# Patient Record
Sex: Female | Born: 1975 | Race: Asian | Hispanic: No | Marital: Married | Smoking: Former smoker
Health system: Southern US, Community
[De-identification: ages and names within clinical notes are randomized; demographics above are authoritative.]

## PROBLEM LIST (undated history)

## (undated) DIAGNOSIS — C801 Malignant (primary) neoplasm, unspecified: Secondary | ICD-10-CM

## (undated) HISTORY — DX: Malignant (primary) neoplasm, unspecified: C80.1

## (undated) HISTORY — PX: ABDOMINAL HYSTERECTOMY: SHX81

---

## 2016-09-15 ENCOUNTER — Other Ambulatory Visit: Payer: Self-pay | Admitting: Family Medicine

## 2016-09-15 DIAGNOSIS — N644 Mastodynia: Secondary | ICD-10-CM

## 2016-09-23 ENCOUNTER — Ambulatory Visit
Admission: RE | Admit: 2016-09-23 | Discharge: 2016-09-23 | Disposition: A | Discharge status: Home / Self Care | Payer: 59 | Source: Ambulatory Visit | Attending: Family Medicine | Admitting: Family Medicine

## 2016-09-23 DIAGNOSIS — N644 Mastodynia: Secondary | ICD-10-CM

## 2017-09-20 ENCOUNTER — Other Ambulatory Visit: Payer: Self-pay | Admitting: Family Medicine

## 2017-09-20 DIAGNOSIS — Z1231 Encounter for screening mammogram for malignant neoplasm of breast: Secondary | ICD-10-CM

## 2017-10-20 ENCOUNTER — Ambulatory Visit: Payer: 59

## 2017-10-20 ENCOUNTER — Ambulatory Visit
Admission: RE | Admit: 2017-10-20 | Discharge: 2017-10-20 | Disposition: A | Discharge status: Home / Self Care | Payer: 59 | Source: Ambulatory Visit | Attending: Family Medicine | Admitting: Family Medicine

## 2017-10-20 DIAGNOSIS — Z1231 Encounter for screening mammogram for malignant neoplasm of breast: Secondary | ICD-10-CM

## 2018-11-11 ENCOUNTER — Other Ambulatory Visit: Payer: Self-pay | Admitting: Family Medicine

## 2018-11-11 DIAGNOSIS — Z1231 Encounter for screening mammogram for malignant neoplasm of breast: Secondary | ICD-10-CM

## 2018-12-26 ENCOUNTER — Ambulatory Visit: Payer: 59

## 2019-02-10 ENCOUNTER — Other Ambulatory Visit: Payer: Self-pay

## 2019-02-10 ENCOUNTER — Ambulatory Visit
Admission: RE | Admit: 2019-02-10 | Discharge: 2019-02-10 | Disposition: A | Discharge status: Home / Self Care | Payer: Managed Care, Other (non HMO) | Source: Ambulatory Visit | Attending: Family Medicine | Admitting: Family Medicine

## 2019-02-10 DIAGNOSIS — Z1231 Encounter for screening mammogram for malignant neoplasm of breast: Secondary | ICD-10-CM

## 2019-05-19 ENCOUNTER — Other Ambulatory Visit: Payer: Self-pay

## 2019-05-22 ENCOUNTER — Other Ambulatory Visit: Payer: Self-pay

## 2019-05-22 ENCOUNTER — Ambulatory Visit (INDEPENDENT_AMBULATORY_CARE_PROVIDER_SITE_OTHER): Payer: 59 | Admitting: Obstetrics and Gynecology

## 2019-05-22 ENCOUNTER — Encounter: Payer: Self-pay | Admitting: Obstetrics and Gynecology

## 2019-05-22 VITALS — BP 118/74 | Ht 63.0 in | Wt 121.0 lb

## 2019-05-22 DIAGNOSIS — Z01419 Encounter for gynecological examination (general) (routine) without abnormal findings: Secondary | ICD-10-CM

## 2019-05-22 DIAGNOSIS — Z8542 Personal history of malignant neoplasm of other parts of uterus: Secondary | ICD-10-CM | POA: Diagnosis not present

## 2019-05-22 DIAGNOSIS — Z1272 Encounter for screening for malignant neoplasm of vagina: Secondary | ICD-10-CM | POA: Diagnosis not present

## 2019-05-22 DIAGNOSIS — C55 Malignant neoplasm of uterus, part unspecified: Secondary | ICD-10-CM | POA: Insufficient documentation

## 2019-05-22 NOTE — Progress Notes (Signed)
   Timika Drolet 05-02-1975 [id number]  SUBJECTIVE:  44 y.o. G1P0010 female new patient for annual routine gynecologic exam and Pap smear.  She had a hysterectomy in 2017 due to grade 2 endometrial cancer in [place]. Ovaries left insitu.  She says she did not require chemotherapy or radiation.  She is originally from [place] and was diagnosed with endometrial cancer there.  She says she was using hormonal treatments in [place] as she was considering conceiving, but then moved to the [place] in 2016 and she then decided to undergo hysterectomy.  No family history of any cancers. She has no gynecologic concerns at this time.  No vaginal bleeding or pelvic pain.  Starting to feel mild hot flashes and night sweats.  Current Outpatient Medications  Medication Sig Dispense Refill  . Multiple Vitamin (MULTIVITAMIN) tablet Take 1 tablet by mouth daily.     No current facility-administered medications for this visit.   Allergies: Patient has no known allergies.  No LMP recorded. Patient has had a hysterectomy.  Past medical history,surgical history, problem list, medications, allergies, family history and social history were all reviewed and documented as reviewed in the EPIC chart.  ROS:  Feeling well. No dyspnea or chest pain on exertion.  No abdominal pain, change in bowel habits, black or bloody stools.  No urinary tract symptoms. GYN ROS: no abnormal bleeding, pelvic pain or discharge, no breast pain or new or enlarging lumps on self exam. No neurological complaints.   OBJECTIVE:  BP 118/74   Ht 5\' 3"  (1.6 m)   Wt 121 lb (54.9 kg)   BMI 21.43 kg/m  The patient appears well, alert, oriented x 3, in no distress. ENT normal.  Neck supple. No cervical or supraclavicular adenopathy or thyromegaly.  Lungs are clear, good air entry, no wheezes, rhonchi or rales. S1 and S2 normal, no murmurs, regular rate and rhythm.  Abdomen soft without tenderness, guarding, mass or organomegaly.  Neurological is normal, no  focal findings.  BREAST EXAM: breasts appear normal, no suspicious masses, no skin or nipple changes or axillary nodes  PELVIC EXAM: VULVA: normal appearing vulva with no masses, tenderness or lesions, VAGINA: normal appearing vagina with normal color and discharge, no lesions, CERVIX: surgically absent, UTERUS: surgically absent, vaginal cuff normal, ADNEXA: normal adnexa in size, nontender and no masses, RECTAL: normal rectal, no masses, PAP: Pap smear done today, thin-prep method  Chaperone: Caryn Bee present during the examination  ASSESSMENT:  44 y.o. G1P0010 here for annual gynecologic exam  PLAN:   1. No menstrual or significant hormonal concerns.  We discussed perimenopausal symptoms which she may be starting to experience.  Let us know if any significant problems. 2. Pap smear collected today.  Current screening guidelines calling for the 3-year Pap smear cytology only interval. 3. Contraception.  Previous ysterectomy. 4. History of uterine cancer.  Treated in [place]. No records to review today. Sounds like it was caught in early stage.  Recommend continuing annual pelvic exam and Pap smears at routine interval.   Exam NED today. 5. Mammogram 01/2019.  Continue annual mammograms.  Normal breast exam.  Encouraged breast self awareness and notify us of any concerning changes. 6. Health maintenance.  Routine screening blood work usually done with her primary doctor.  Return annually or sooner, prn.  Joseph Pierini MD, FACOG  05/22/19

## 2019-05-22 NOTE — Addendum Note (Signed)
Addended by: Nelva Nay on: 05/22/2019 09:32 AM   Modules accepted: Orders

## 2019-05-23 LAB — PAP IG W/ RFLX HPV ASCU

## 2019-12-08 ENCOUNTER — Other Ambulatory Visit: Payer: Self-pay

## 2019-12-08 ENCOUNTER — Encounter (HOSPITAL_COMMUNITY): Payer: Self-pay

## 2019-12-08 ENCOUNTER — Emergency Department (HOSPITAL_COMMUNITY)
Admission: EM | Admit: 2019-12-08 | Discharge: 2019-12-08 | Disposition: A | Discharge status: Home / Self Care | Payer: 59 | Attending: Emergency Medicine | Admitting: Emergency Medicine

## 2019-12-08 DIAGNOSIS — Z20822 Contact with and (suspected) exposure to covid-19: Secondary | ICD-10-CM | POA: Diagnosis not present

## 2019-12-08 DIAGNOSIS — R45851 Suicidal ideations: Secondary | ICD-10-CM | POA: Insufficient documentation

## 2019-12-08 DIAGNOSIS — Z87891 Personal history of nicotine dependence: Secondary | ICD-10-CM | POA: Insufficient documentation

## 2019-12-08 DIAGNOSIS — F10929 Alcohol use, unspecified with intoxication, unspecified: Secondary | ICD-10-CM

## 2019-12-08 DIAGNOSIS — F1010 Alcohol abuse, uncomplicated: Secondary | ICD-10-CM

## 2019-12-08 LAB — CBC WITH DIFFERENTIAL/PLATELET
Abs Immature Granulocytes: 0.02 10*3/uL (ref 0.00–0.07)
Basophils Absolute: 0.1 10*3/uL (ref 0.0–0.1)
Basophils Relative: 1 %
Eosinophils Absolute: 0 10*3/uL (ref 0.0–0.5)
Eosinophils Relative: 0 %
HCT: 41.4 % (ref 36.0–46.0)
Hemoglobin: 13.6 g/dL (ref 12.0–15.0)
Immature Granulocytes: 0 %
Lymphocytes Relative: 41 %
Lymphs Abs: 3.2 10*3/uL (ref 0.7–4.0)
MCH: 29.6 pg (ref 26.0–34.0)
MCHC: 32.9 g/dL (ref 30.0–36.0)
MCV: 90.2 fL (ref 80.0–100.0)
Monocytes Absolute: 0.6 10*3/uL (ref 0.1–1.0)
Monocytes Relative: 8 %
Neutro Abs: 3.9 10*3/uL (ref 1.7–7.7)
Neutrophils Relative %: 50 %
Platelets: 313 10*3/uL (ref 150–400)
RBC: 4.59 MIL/uL (ref 3.87–5.11)
RDW: 13 % (ref 11.5–15.5)
WBC: 7.7 10*3/uL (ref 4.0–10.5)
nRBC: 0 % (ref 0.0–0.2)

## 2019-12-08 LAB — COMPREHENSIVE METABOLIC PANEL
ALT: 31 U/L (ref 0–44)
AST: 42 U/L — ABNORMAL HIGH (ref 15–41)
Albumin: 4.2 g/dL (ref 3.5–5.0)
Alkaline Phosphatase: 44 U/L (ref 38–126)
Anion gap: 17 — ABNORMAL HIGH (ref 5–15)
BUN: 20 mg/dL (ref 6–20)
CO2: 18 mmol/L — ABNORMAL LOW (ref 22–32)
Calcium: 9 mg/dL (ref 8.9–10.3)
Chloride: 104 mmol/L (ref 98–111)
Creatinine, Ser: 0.77 mg/dL (ref 0.44–1.00)
GFR, Estimated: >60 mL/min (ref >60)
Glucose, Bld: 117 mg/dL — ABNORMAL HIGH (ref 70–99)
Potassium: 3.3 mmol/L — ABNORMAL LOW (ref 3.5–5.1)
Sodium: 139 mmol/L (ref 135–145)
Total Bilirubin: 0.7 mg/dL (ref 0.3–1.2)
Total Protein: 7.1 g/dL (ref 6.5–8.1)

## 2019-12-08 LAB — RESPIRATORY PANEL BY RT PCR (FLU A&B, COVID)
Influenza A by PCR: NEGATIVE
Influenza B by PCR: NEGATIVE
SARS Coronavirus 2 by RT PCR: NEGATIVE

## 2019-12-08 LAB — RAPID URINE DRUG SCREEN, HOSP PERFORMED
Amphetamines: NOT DETECTED
Barbiturates: NOT DETECTED
Benzodiazepines: NOT DETECTED
Cocaine: NOT DETECTED
Opiates: NOT DETECTED
Tetrahydrocannabinol: NOT DETECTED

## 2019-12-08 LAB — ETHANOL: Alcohol, Ethyl (B): 168 mg/dL — ABNORMAL HIGH (ref <10)

## 2019-12-08 LAB — ACETAMINOPHEN LEVEL: Acetaminophen (Tylenol), Serum: <10 ug/mL — ABNORMAL LOW (ref 10–30)

## 2019-12-08 LAB — SALICYLATE LEVEL: Salicylate Lvl: <7 mg/dL — ABNORMAL LOW (ref 7.0–30.0)

## 2019-12-08 LAB — LIPASE, BLOOD: Lipase: 40 U/L (ref 11–51)

## 2019-12-08 MED ORDER — THIAMINE HCL 100 MG PO TABS: 100.0000 mg | ORAL_TABLET | Freq: Every day | ORAL | Status: DC

## 2019-12-08 MED ORDER — SODIUM CHLORIDE 0.9 % IV BOLUS
1000.0000 mL | Freq: Once | INTRAVENOUS | Status: AC
  Administered 2019-12-08: 1000 mL via INTRAVENOUS

## 2019-12-08 MED ORDER — ONDANSETRON HCL 4 MG/2ML IJ SOLN
4.0000 mg | Freq: Once | INTRAMUSCULAR | Status: AC
  Administered 2019-12-08: 4 mg via INTRAVENOUS
  Filled 2019-12-08: qty 2

## 2019-12-08 MED ORDER — LORAZEPAM 2 MG/ML IJ SOLN: 0.0000 mg | Freq: Four times a day (QID) | INTRAMUSCULAR | Status: DC

## 2019-12-08 MED ORDER — LORAZEPAM 1 MG PO TABS: 0.0000 mg | ORAL_TABLET | Freq: Four times a day (QID) | ORAL | Status: DC

## 2019-12-08 MED ORDER — LORAZEPAM 2 MG/ML IJ SOLN: 0.0000 mg | Freq: Two times a day (BID) | INTRAMUSCULAR | Status: DC

## 2019-12-08 MED ORDER — THIAMINE HCL 100 MG/ML IJ SOLN: 100.0000 mg | Freq: Every day | INTRAMUSCULAR | Status: DC

## 2019-12-08 MED ORDER — LORAZEPAM 1 MG PO TABS: 0.0000 mg | ORAL_TABLET | Freq: Two times a day (BID) | ORAL | Status: DC

## 2019-12-08 NOTE — Discharge Instructions (Signed)
For your behavioral health needs, [facility] are advised to follow up with an outpatient therapist.  Contact one of the following providers at your earliest opportunity to schedule an intake appointment:       [facility]. [address] [place], [place] [address]      [phone]       [facility]      [address] [place], [place] [address]      [phone]       [address] [place], [place] [address]      [phone]
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
## 2019-12-08 NOTE — ED Notes (Signed)
Pt refused breakfast tray.  

## 2019-12-08 NOTE — BH Assessment (Signed)
[place] Assessment Progress Note  Per Letitia Libra, NP, this pt does not require psychiatric hospitalization at this time.  Pt is psychiatrically cleared.  Discharge instructions include referral information for area therapists for follow up at pt's initiative.  EDP Lacretia Leigh, MD and pt's nurse, Caryl Pina, have been notified.  Jalene Mullet, [place] Triage Specialist [phone]

## 2019-12-08 NOTE — ED Notes (Signed)
TTS in room assessing patient

## 2019-12-08 NOTE — ED Notes (Signed)
Pt dressed out in hospital provided scrubs. Belongings consisted of black t shirt, pick shorts, and an apple watch. All collected and placed in designated holding area.

## 2019-12-08 NOTE — Consult Note (Signed)
[facility] ED Discharge  12/08/2019 10:07 AM Kristin Reed  MRN:  [id number] Principal Problem: Alcohol intoxication [facility]) Discharge Diagnoses: Principal Problem:   Alcohol intoxication ([place])   Subjective: Patient states "last night I was aggravated and drink a bottle of wine very fast, I got drunk then I started crying."  Patient reports feeling homesick that she has been unable to travel to [place] for a long while.  Patient reports feeling somewhat depressed related to Covid and her inability to travel to [place] where her extended family resides.  Patient reports she is primarily supported by her husband of the home but also speaks with her mother every day via video chat.  Patient denies suicidal ideations currently.  Patient denies any history of self-harm behaviors, denies history of suicide attempts.  Patient assessed by nurse practitioner.  Patient alert and oriented, answers appropriately.  Patient pleasant cooperative during assessment.  Patient request that husband, Max Pablo Ledger, remain in room during assessment.  Patient denies homicidal ideations.  Patient denies any symptoms of paranoia.  There is no evidence of delusional thought content and patient does not appear to be responding to internal stimuli.  Patient reports she sleeps 8 hours or more each day and her appetite is "good."  Patient resides in [place] with her husband.  Patient denies access to weapons.  Patient is currently employed and works from home.  Patient denies substance use.  Patient endorses alcohol use approximately 1 to 2 days/week.  Patient endorses approximately 2 glasses of wine approximately 2 days/week.  At this time patient declines any substance use treatment resources.  Patient reports she does not feel that she needs counseling at this time states "I talk to all my friends all the time."  Patient will be given counseling resources upon discharge.  Patient gives verbal consent to speak with her husband, Max.   Patient's husband denies any concerns for patient safety.  Patient's husband also denies any access to weapons in the home.  Both patient and husband report readiness for patient to discharge home today.  Support and encouragement offered.  Total Time spent with patient: 30 minutes  Past Psychiatric History: None reported  Past Medical History:  Past Medical History:  Diagnosis Date  . Cancer [facility])    Uterine    Past Surgical History:  Procedure Laterality Date  . ABDOMINAL HYSTERECTOMY     TAH   Family History: No family history on file. Family Psychiatric  History: None reported Social History:  Social History   Substance and Sexual Activity  Alcohol Use Yes     Social History   Substance and Sexual Activity  Drug Use Never    Social History   Socioeconomic History  . Marital status: Married    Spouse name: Not on file  . Number of children: Not on file  . Years of education: Not on file  . Highest education level: Not on file  Occupational History  . Not on file  Tobacco Use  . Smoking status: Former Research scientist (life sciences)  . Smokeless tobacco: Never Used  Vaping Use  . Vaping Use: Never used  Substance and Sexual Activity  . Alcohol use: Yes  . Drug use: Never  . Sexual activity: Yes    Birth control/protection: Surgical    Comment: 1st intercourse 44 yo-Fewer than 5 partners  Other Topics Concern  . Not on file  Social History Narrative  . Not on file   Social Determinants of Health   Financial Resource Strain:   .  Difficulty of Paying Living Expenses: Not on file  Food Insecurity:   . Worried About Charity fundraiser in the Last Year: Not on file  . Ran Out of Food in the Last Year: Not on file  Transportation Needs:   . Lack of Transportation (Medical): Not on file  . Lack of Transportation (Non-Medical): Not on file  Physical Activity:   . Days of Exercise per Week: Not on file  . Minutes of Exercise per Session: Not on file  Stress:   . Feeling of  Stress : Not on file  Social Connections:   . Frequency of Communication with Friends and Family: Not on file  . Frequency of Social Gatherings with Friends and Family: Not on file  . Attends Religious Services: Not on file  . Active Member of Clubs or Organizations: Not on file  . Attends Archivist Meetings: Not on file  . Marital Status: Not on file    Has this patient used any form of tobacco in the last 30 days? (Cigarettes, Smokeless Tobacco, Cigars, and/or Pipes) A prescription for an FDA-approved tobacco cessation medication was offered at discharge and the patient refused  Current Medications: Current Facility-Administered Medications  Medication Dose Route Frequency Provider Last Rate Last Admin  . LORazepam (ATIVAN) injection 0-4 mg  0-4 mg Intravenous Q6H Caccavale, Sophia, PA-C       Or  . LORazepam (ATIVAN) tablet 0-4 mg  0-4 mg Oral Q6H Caccavale, Sophia, PA-C      . [START ON 12/10/2019] LORazepam (ATIVAN) injection 0-4 mg  0-4 mg Intravenous Q12H Caccavale, Sophia, PA-C       Or  . [START ON 12/10/2019] LORazepam (ATIVAN) tablet 0-4 mg  0-4 mg Oral Q12H Caccavale, Sophia, PA-C      . thiamine tablet 100 mg  100 mg Oral Daily Caccavale, Sophia, PA-C       Or  . thiamine (B-1) injection 100 mg  100 mg Intravenous Daily Caccavale, Sophia, PA-C       No current outpatient medications on file.   PTA Medications: (Not in a hospital admission)   Musculoskeletal: Strength & Muscle Tone: within normal limits Gait & Station: normal Patient leans: N/A  Psychiatric Specialty Exam: Physical Exam Vitals and nursing note reviewed.  Constitutional:      Appearance: She is well-developed.  HENT:     Head: Normocephalic.  Cardiovascular:     Rate and Rhythm: Normal rate.  Pulmonary:     Effort: Pulmonary effort is normal.  Neurological:     Mental Status: She is alert and oriented to person, place, and time.  Psychiatric:        Attention and Perception:  Attention and perception normal.        Mood and Affect: Mood and affect normal.        Speech: Speech normal.        Behavior: Behavior normal. Behavior is cooperative.        Thought Content: Thought content normal.        Cognition and Memory: Cognition and memory normal.        Judgment: Judgment normal.     Review of Systems  Constitutional: Negative.   HENT: Negative.   Eyes: Negative.   Respiratory: Negative.   Cardiovascular: Negative.   Gastrointestinal: Negative.   Genitourinary: Negative.   Musculoskeletal: Negative.   Skin: Negative.   Neurological: Negative.   Psychiatric/Behavioral: Negative.     Blood pressure 102/65, pulse 67, temperature (!)  97.5 F (36.4 C), temperature source Axillary, resp. rate 14, height 5\' 3"  (1.6 m), weight 55 kg, SpO2 100 %.Body mass index is 21.48 kg/m.  General Appearance: Casual and Fairly Groomed  Eye Contact:  Good  Speech:  Clear and Coherent and Normal Rate  Volume:  Normal  Mood:  Euthymic  Affect:  Appropriate and Congruent  Thought Process:  Coherent, Goal Directed and Descriptions of Associations: Intact  Orientation:  Full (Time, Place, and Person)  Thought Content:  WDL and Logical  Suicidal Thoughts:  No  Homicidal Thoughts:  No  Memory:  Immediate;   Good Recent;   Good Remote;   Good  Judgement:  Good  Insight:  Fair  Psychomotor Activity:  Normal  Concentration:  Concentration: Good and Attention Span: Good  Recall:  Good  Fund of Knowledge:  Good  Language:  Good  Akathisia:  No  Handed:  Right  AIMS (if indicated):     Assets:  Communication Skills Desire for Improvement Financial Resources/Insurance Housing Intimacy Leisure Time Physical Health Resilience Social Support  ADL's:  Intact  Cognition:  WNL  Sleep:        Demographic Factors:  NA  Loss Factors: NA  Historical Factors: NA  Risk Reduction Factors:   Sense of responsibility to family, Living with another person, especially  a relative, Positive social support, Positive therapeutic relationship and Positive coping skills or problem solving skills  Continued Clinical Symptoms:    Cognitive Features That Contribute To Risk:  None    Suicide Risk:  Minimal: No identifiable suicidal ideation.  Patients presenting with no risk factors but with morbid ruminations; may be classified as minimal risk based on the severity of the depressive symptoms    Plan Of Care/Follow-up recommendations:  Other:  Patient reviewed with Dr. Hampton Abbot.  Follow-up with outpatient counseling.  Disposition: Patient cleared by psychiatry. Emmaline Kluver, FNP 12/08/2019, 10:07 AM

## 2019-12-08 NOTE — ED Notes (Signed)
Visitor in room. Visitor checked by security. Visitor brought patient clothes and phone, patient with patient belongings in secure location at nurses desk. Explained to visitor that time permitted is 30 mins and next time to come between 10am-9pm

## 2019-12-08 NOTE — ED Provider Notes (Signed)
Emergency Medicine Observation Re-evaluation Note  Kristin Reed is a 44 y.o. female, seen on rounds today.  Pt initially presented to the ED for complaints of Suicidal Currently, the patient is resting comfortably.  Denies any suicidal ideations..  Physical Exam  BP 103/69    Pulse (!) 55    Temp (!) 97.5 F (36.4 C) (Axillary)    Resp 16    Ht 1.6 m (5\' 3" )    Wt 55 kg    SpO2 99%    BMI 21.48 kg/m  Physical Exam General: Calm Cardiac: Regular rate  Lungs: Clear Psych: At baseline  ED Course / MDM  EKG:    I have reviewed the labs performed to date as well as medications administered while in observation.  Recent changes in the last 24 hours include improved insight .  Plan  Current plan is for discharge per behavioral health recommendations. Patient is not under full IVC at this time.   Kristin Leigh, MD 12/08/19 1020
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
## 2020-03-22 ENCOUNTER — Other Ambulatory Visit: Payer: Self-pay | Admitting: Family Medicine

## 2020-03-22 DIAGNOSIS — Z1231 Encounter for screening mammogram for malignant neoplasm of breast: Secondary | ICD-10-CM

## 2020-05-07 ENCOUNTER — Ambulatory Visit: Payer: 59

## 2020-05-13 ENCOUNTER — Other Ambulatory Visit: Payer: Self-pay

## 2020-05-13 ENCOUNTER — Ambulatory Visit
Admission: RE | Admit: 2020-05-13 | Discharge: 2020-05-13 | Disposition: A | Discharge status: Home / Self Care | Payer: 59 | Source: Ambulatory Visit | Attending: Family Medicine | Admitting: Family Medicine

## 2020-05-13 DIAGNOSIS — Z1231 Encounter for screening mammogram for malignant neoplasm of breast: Secondary | ICD-10-CM

## 2020-11-21 ENCOUNTER — Ambulatory Visit (INDEPENDENT_AMBULATORY_CARE_PROVIDER_SITE_OTHER): Payer: 59 | Admitting: Obstetrics & Gynecology

## 2020-11-21 ENCOUNTER — Other Ambulatory Visit: Payer: Self-pay

## 2020-11-21 ENCOUNTER — Other Ambulatory Visit (HOSPITAL_COMMUNITY)
Admission: RE | Admit: 2020-11-21 | Discharge: 2020-11-21 | Disposition: A | Discharge status: Home / Self Care | Payer: 59 | Source: Ambulatory Visit | Attending: Obstetrics & Gynecology | Admitting: Obstetrics & Gynecology

## 2020-11-21 ENCOUNTER — Encounter: Payer: Self-pay | Admitting: Obstetrics & Gynecology

## 2020-11-21 VITALS — BP 98/64 | HR 84 | Resp 16 | Ht 62.75 in | Wt 122.0 lb

## 2020-11-21 DIAGNOSIS — C55 Malignant neoplasm of uterus, part unspecified: Secondary | ICD-10-CM

## 2020-11-21 DIAGNOSIS — Z1272 Encounter for screening for malignant neoplasm of vagina: Secondary | ICD-10-CM

## 2020-11-21 DIAGNOSIS — Z90710 Acquired absence of both cervix and uterus: Secondary | ICD-10-CM | POA: Diagnosis not present

## 2020-11-21 DIAGNOSIS — N951 Menopausal and female climacteric states: Secondary | ICD-10-CM

## 2020-11-21 DIAGNOSIS — Z01419 Encounter for gynecological examination (general) (routine) without abnormal findings: Secondary | ICD-10-CM | POA: Insufficient documentation

## 2020-11-21 NOTE — Progress Notes (Signed)
Kristin Reed [birth year] [id number]   History:    45 y.o. G1P0A1 Married.  Works for [employer].  RP:  Established patient presenting for annual gyn exam   HPI:  Occasional hot flushes/night sweats.  Started a PhytoEstrogen last year which controls the symptoms.  Previous Hysterectomy for early Endometrial Ca at age 53 in [place] per patient.  Staging surgery in [facility] 2017.  Will obtain patho report.  No pelvic pain.  No pain with IC.  Breasts normal.  Screening mammo neg 04/2020.  Health labs with Fam MD.  BMI 21.78.  Good fitness and healthy nutrition.   Past medical history,surgical history, family history and social history were all reviewed and documented in the EPIC chart.  Gynecologic History No LMP recorded. Patient has had a hysterectomy.  Obstetric History OB History  Gravida Para Term Preterm AB Living  1       1    SAB IAB Ectopic Multiple Live Births    1          # Outcome Date GA Lbr Len/2nd Weight Sex Delivery Anes PTL Lv  1 IAB              ROS: A ROS was performed and pertinent positives and negatives are included in the history.  GENERAL: No fevers or chills. HEENT: No change in vision, no earache, sore throat or sinus congestion. NECK: No pain or stiffness. CARDIOVASCULAR: No chest pain or pressure. No palpitations. PULMONARY: No shortness of breath, cough or wheeze. GASTROINTESTINAL: No abdominal pain, nausea, vomiting or diarrhea, melena or bright red blood per rectum. GENITOURINARY: No urinary frequency, urgency, hesitancy or dysuria. MUSCULOSKELETAL: No joint or muscle pain, no back pain, no recent trauma. DERMATOLOGIC: No rash, no itching, no lesions. ENDOCRINE: No polyuria, polydipsia, no heat or cold intolerance. No recent change in weight. HEMATOLOGICAL: No anemia or easy bruising or bleeding. NEUROLOGIC: No headache, seizures, numbness, tingling or weakness. PSYCHIATRIC: No depression, no loss of interest in normal activity or change in sleep pattern.      Exam:   BP 98/64   Pulse 84   Resp 16   Ht 5' 2.75" (1.594 m)   Wt 122 lb (55.3 kg)   BMI 21.78 kg/m   Body mass index is 21.78 kg/m.  General appearance : Well developed well nourished female. No acute distress HEENT: Eyes: no retinal hemorrhage or exudates,  Neck supple, trachea midline, no carotid bruits, no thyroidmegaly Lungs: Clear to auscultation, no rhonchi or wheezes, or rib retractions  Heart: Regular rate and rhythm, no murmurs or gallops Breast:Examined in sitting and supine position were symmetrical in appearance, no palpable masses or tenderness,  no skin retraction, no nipple inversion, no nipple discharge, no skin discoloration, no axillary or supraclavicular lymphadenopathy Abdomen: no palpable masses or tenderness, no rebound or guarding Extremities: no edema or skin discoloration or tenderness  Pelvic: Vulva: Normal             Vagina: No gross lesions or discharge.  Pap reflex done.  Cervix/Uterus absent  Adnexa  Without masses or tenderness  Anus: Normal   Assessment/Plan:  45 y.o. female for annual exam   1. 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  Will obtain pathology report from [place].  Pap reflex done at the vaginal vault.  Breast exam normal.  Screening mammogram March 2022 was negative.  Health labs with family physician.  Good body mass index at 21.78.  Continue with fitness and healthy nutrition. - Cytology - PAP( [place])  2. Malignant neoplasm of uterus, unspecified site [facility]) - Cytology - PAP( )  3. S/P total hysterectomy  4. Perimenopausal symptoms Mild perimenopausal symptoms with hot flushes and night sweats.  Controlled with natural estrogens over-the-counter.  Will check McIntosh level today. - FSH  Other orders - Multiple Vitamin (MULTIVITAMIN PO); Take by mouth.    Princess Bruins MD, 8:48 AM 11/21/2020

## 2020-11-22 LAB — FOLLICLE STIMULATING HORMONE: FSH: 45.3 m[IU]/mL

## 2020-11-25 LAB — CYTOLOGY - PAP: Diagnosis: NEGATIVE

## 2021-03-24 IMAGING — MG DIGITAL SCREENING BILAT W/ TOMO W/ CAD
8 series · 9 of 24 positions shown · non-contrast
Comparison: Previous exam(s).

CLINICAL DATA: Screening.

EXAM:
DIGITAL SCREENING BILATERAL MAMMOGRAM WITH TOMO AND CAD

[R MLO synth-2D]
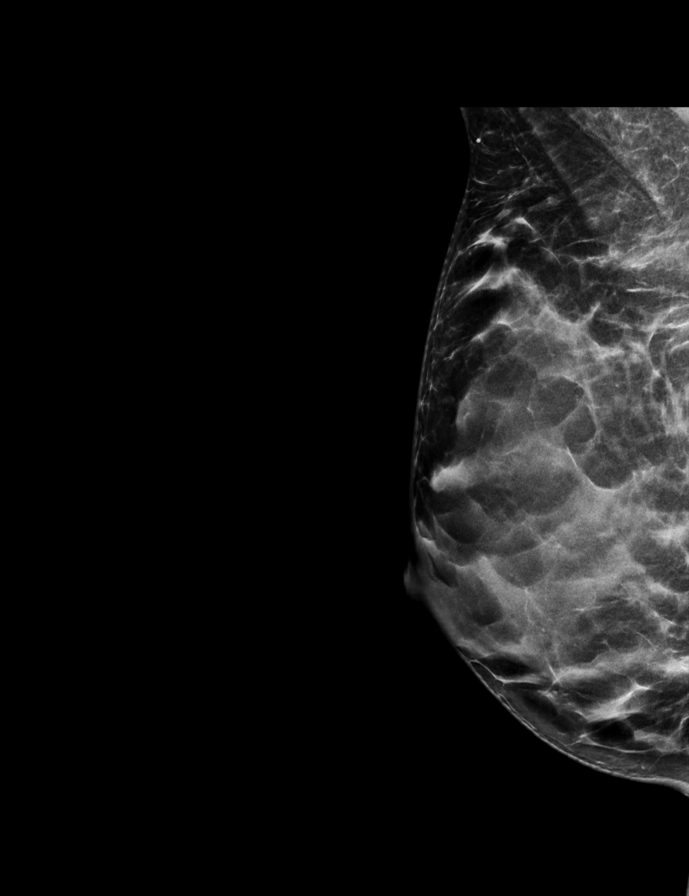

[L MLO synth-2D]
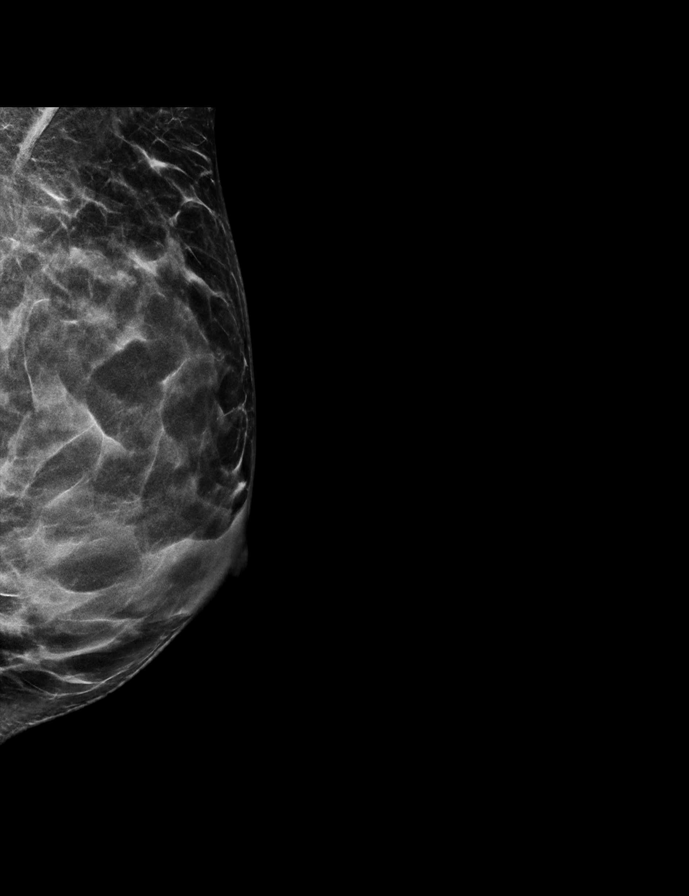

[L CC synth-2D]
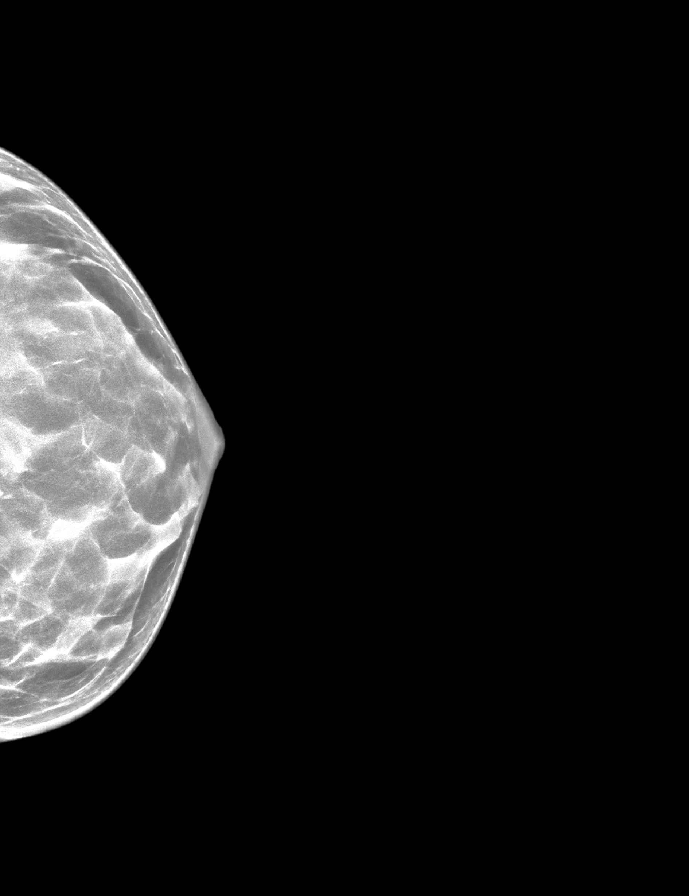

[R CC synth-2D]
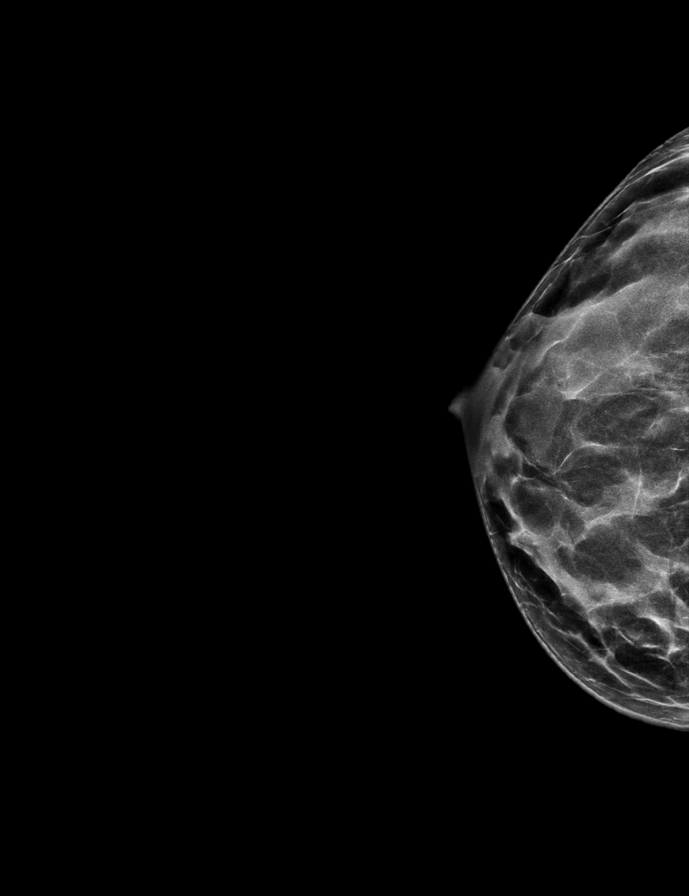

[R CC tomo · 2 of 52 frames shown]
[frame 17/52]
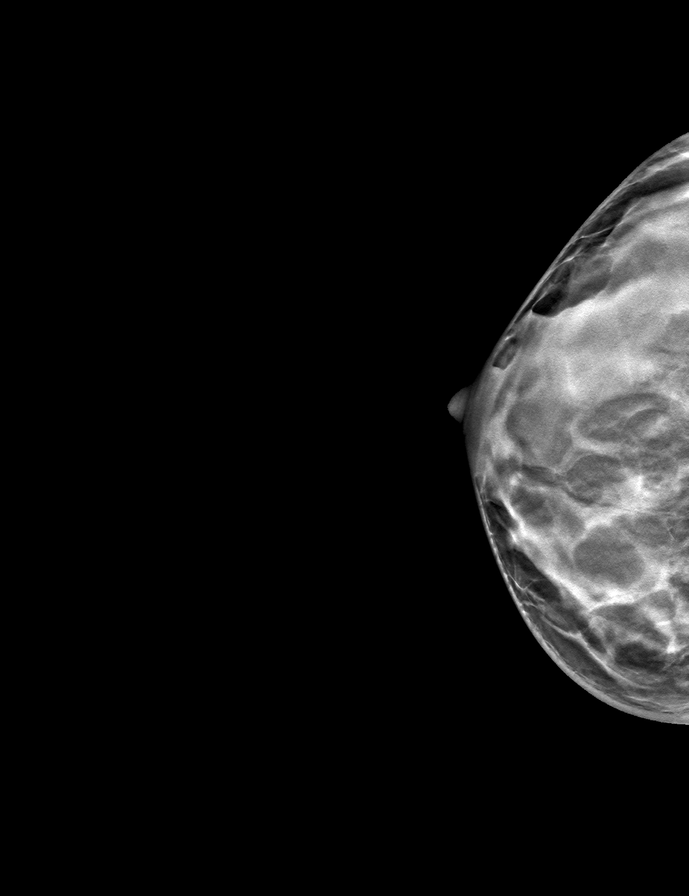
[frame 27/52]
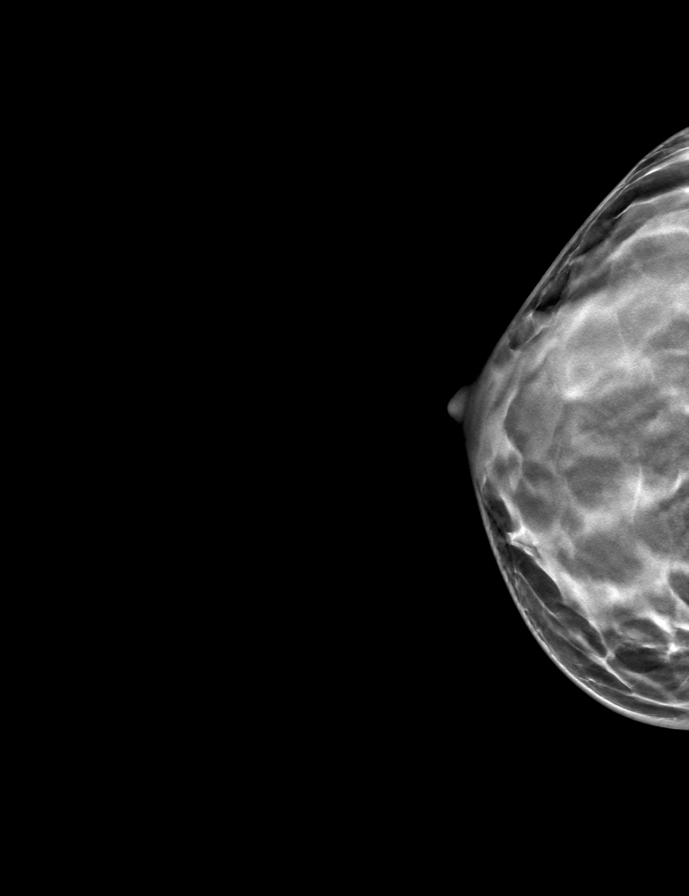

[L MLO tomo · tomo slice 27/52.0]
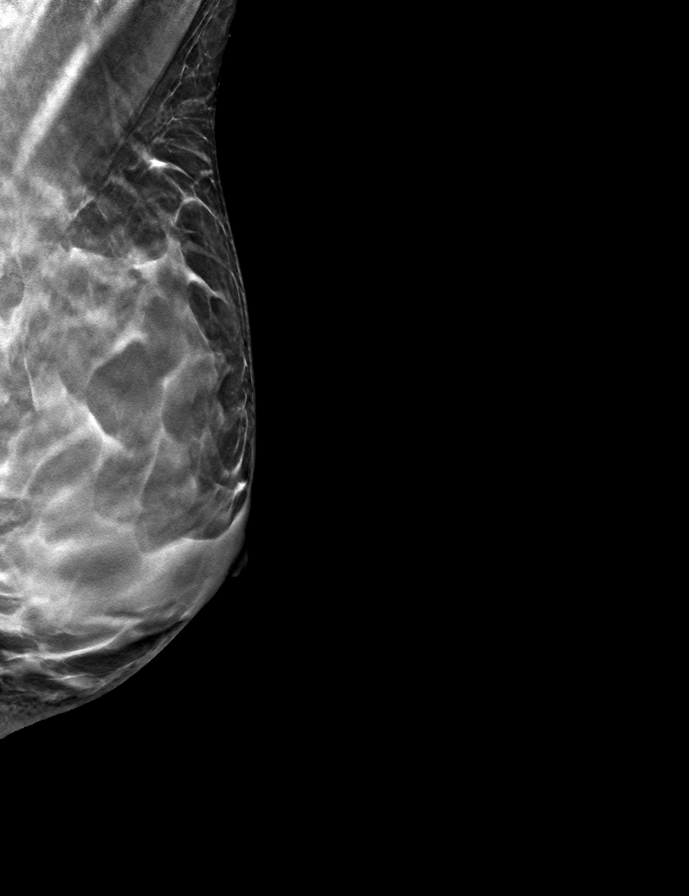

[L CC tomo · tomo slice 26/51.0]
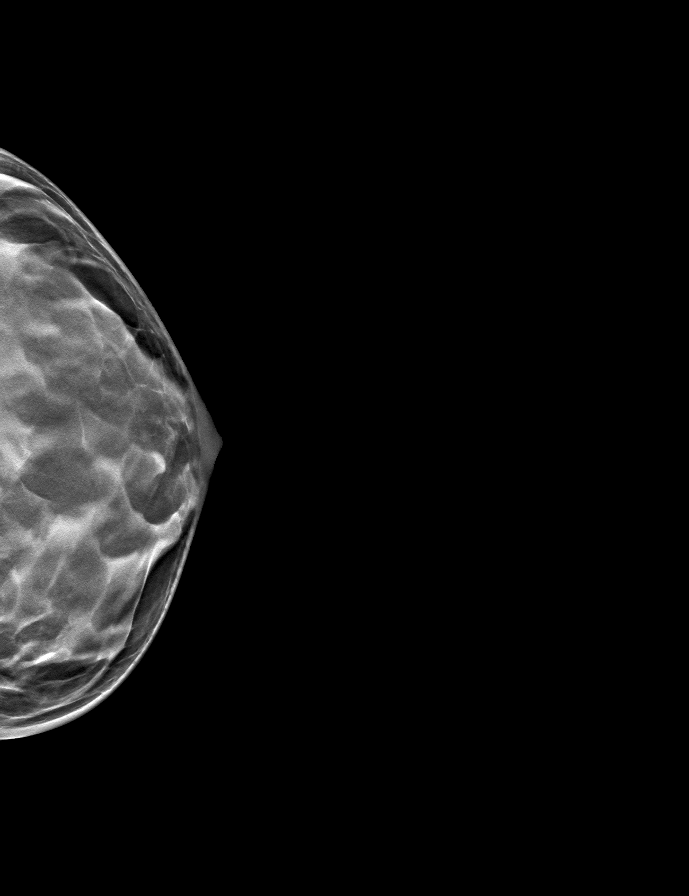

[R MLO tomo · tomo slice 27/54.0]
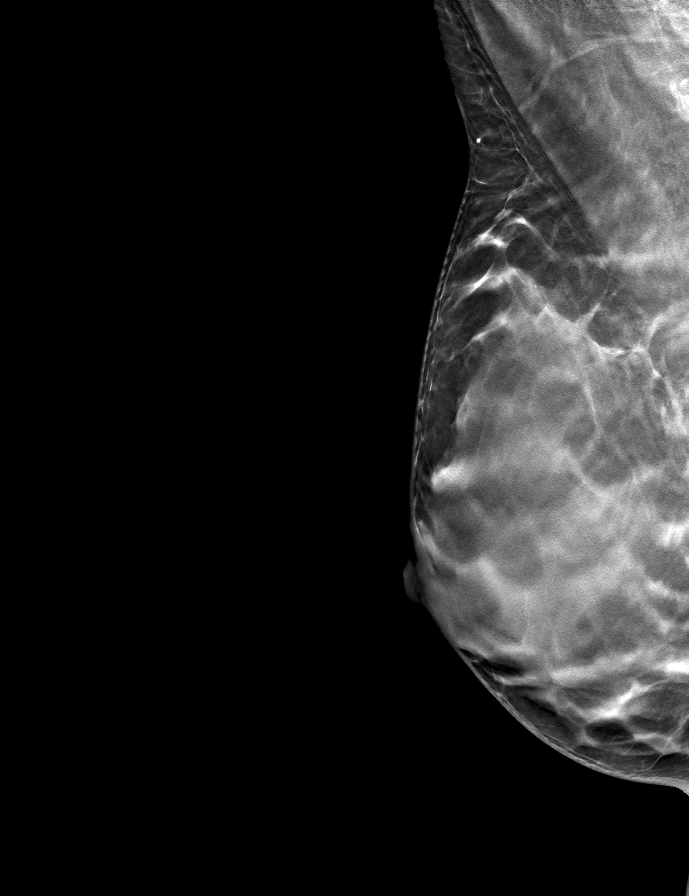

[9 of 24 positions shown; findings below may reference images not displayed]

ACR Breast Density Category d: The breast tissue is extremely dense,
which lowers the sensitivity of mammography
FINDINGS: There are no findings suspicious for malignancy. Images were
processed with CAD.
IMPRESSION: No mammographic evidence of malignancy. A result letter of this
screening mammogram will be mailed directly to the patient.

RECOMMENDATION:
Screening mammogram in one year. (Code:WO-0-ZI0)

BI-RADS CATEGORY  1: Negative.

## 2022-07-01 ENCOUNTER — Other Ambulatory Visit: Payer: Self-pay | Admitting: Family Medicine

## 2022-07-01 DIAGNOSIS — Z1231 Encounter for screening mammogram for malignant neoplasm of breast: Secondary | ICD-10-CM

## 2022-08-19 ENCOUNTER — Ambulatory Visit (INDEPENDENT_AMBULATORY_CARE_PROVIDER_SITE_OTHER): Payer: 59 | Admitting: Obstetrics & Gynecology

## 2022-08-19 ENCOUNTER — Encounter: Payer: Self-pay | Admitting: Obstetrics & Gynecology

## 2022-08-19 VITALS — BP 116/70 | HR 68 | Ht 63.0 in | Wt 125.0 lb

## 2022-08-19 DIAGNOSIS — Z01419 Encounter for gynecological examination (general) (routine) without abnormal findings: Secondary | ICD-10-CM | POA: Diagnosis not present

## 2022-08-19 DIAGNOSIS — Z90710 Acquired absence of both cervix and uterus: Secondary | ICD-10-CM

## 2022-08-19 DIAGNOSIS — C55 Malignant neoplasm of uterus, part unspecified: Secondary | ICD-10-CM

## 2022-08-19 DIAGNOSIS — Z8542 Personal history of malignant neoplasm of other parts of uterus: Secondary | ICD-10-CM

## 2022-08-19 DIAGNOSIS — Z780 Asymptomatic menopausal state: Secondary | ICD-10-CM

## 2022-08-19 NOTE — Progress Notes (Signed)
Kristin Reed 1975/03/23 [id number]   History:    47 y.o. G1P0A1 Married.  Works for [employer].   RP:  Established patient presenting for annual gyn exam    HPI:  Postmenopause, Sxs controled on Estroven. FSH 10/2020 at 45.3.  Insomnia. Previous Hysterectomy for early Endometrial Ca at age 80 in [place] per patient.  Staging surgery in [facility] 2017.  Ovaries preserved.  No pelvic pain.  No pain with IC. Pap Neg 10/2020.  No indication to repeat Pap at this time. Breasts normal.  Screening mammo neg 04/2020, repeat Mammo scheduled.  Health labs with Fam MD.  BMI 22.14.  Good fitness and healthy nutrition.   Past medical history,surgical history, family history and social history were all reviewed and documented in the EPIC chart.  Gynecologic History No LMP recorded. Patient has had a hysterectomy.  Obstetric History OB History  Gravida Para Term Preterm AB Living  1       1    SAB IAB Ectopic Multiple Live Births    1          # Outcome Date GA Lbr Len/2nd Weight Sex Delivery Anes PTL Lv  1 IAB              ROS: A ROS was performed and pertinent positives and negatives are included in the history. GENERAL: No fevers or chills. HEENT: No change in vision, no earache, sore throat or sinus congestion. NECK: No pain or stiffness. CARDIOVASCULAR: No chest pain or pressure. No palpitations. PULMONARY: No shortness of breath, cough or wheeze. GASTROINTESTINAL: No abdominal pain, nausea, vomiting or diarrhea, melena or bright red blood per rectum. GENITOURINARY: No urinary frequency, urgency, hesitancy or dysuria. MUSCULOSKELETAL: No joint or muscle pain, no back pain, no recent trauma. DERMATOLOGIC: No rash, no itching, no lesions. ENDOCRINE: No polyuria, polydipsia, no heat or cold intolerance. No recent change in weight. HEMATOLOGICAL: No anemia or easy bruising or bleeding. NEUROLOGIC: No headache, seizures, numbness, tingling or weakness. PSYCHIATRIC: No depression, no loss of interest in  normal activity or change in sleep pattern.     Exam:   BP 116/70 (BP Location: Left Arm, Patient Position: Sitting, Cuff Size: Normal)   Pulse 68   Ht 5\' 3"  (1.6 m)   Wt 125 lb (56.7 kg)   SpO2 99%   BMI 22.14 kg/m   Body mass index is 22.14 kg/m.  General appearance : Well developed well nourished female. No acute distress HEENT: Eyes: no retinal hemorrhage or exudates,  Neck supple, trachea midline, no carotid bruits, no thyroidmegaly Lungs: Clear to auscultation, no rhonchi or wheezes, or rib retractions  Heart: Regular rate and rhythm, no murmurs or gallops Breast:Examined in sitting and supine position were symmetrical in appearance, no palpable masses or tenderness,  no skin retraction, no nipple inversion, no nipple discharge, no skin discoloration, no axillary or supraclavicular lymphadenopathy Abdomen: no palpable masses or tenderness, no rebound or guarding Extremities: no edema or skin discoloration or tenderness  Pelvic: Vulva: Normal             Vagina: No gross lesions or discharge  Cervix/Uterus absent  Adnexa  Without masses or tenderness  Anus: Normal   Assessment/Plan:  47 y.o. female for annual exam   1. Well female exam with routine gynecological exam Postmenopause, Sxs controled on Estroven. FSH 10/2020 at 45.3.  Insomnia. Previous Hysterectomy for early Endometrial Ca at age 48 in [place] per patient.  Staging surgery in [facility] 2017.  Ovaries  preserved.  No pelvic pain.  No pain with IC. Pap Neg 10/2020.  No indication to repeat Pap at this time. Breasts normal.  Screening mammo neg 04/2020, repeat Mammo scheduled.  Health labs with Fam MD.  BMI 22.14.  Good fitness and healthy nutrition.  2. S/P total hysterectomy  3. Malignant neoplasm of uterus, unspecified site [facility]) Previous Hysterectomy for early Endometrial Ca at age 71 in [place] per patient.  Staging surgery in [facility] 2017.  Ovaries preserved.  Pap Neg 10/2020.  4. Postmenopause Postmenopause, Sxs  controled on Estroven. FSH 10/2020 at 45.3.  Insomnia. Counseling done on natural ways to improve insomnia.  Recommend to continue all her good life habits and add meditation.  Other orders - Rhubarb (ESTROVEN COMPLETE) 4 MG TABS; as directed Orally   Genia Del MD, 3:44 PM

## 2022-09-02 ENCOUNTER — Ambulatory Visit
Admission: RE | Admit: 2022-09-02 | Discharge: 2022-09-02 | Disposition: A | Discharge status: Home / Self Care | Payer: 59 | Source: Ambulatory Visit | Attending: Family Medicine | Admitting: Family Medicine

## 2022-09-02 DIAGNOSIS — Z1231 Encounter for screening mammogram for malignant neoplasm of breast: Secondary | ICD-10-CM

## 2023-05-26 ENCOUNTER — Other Ambulatory Visit: Payer: Self-pay | Admitting: Family Medicine

## 2023-05-26 DIAGNOSIS — Z1231 Encounter for screening mammogram for malignant neoplasm of breast: Secondary | ICD-10-CM

## 2023-09-03 ENCOUNTER — Ambulatory Visit
Admission: RE | Admit: 2023-09-03 | Discharge: 2023-09-03 | Disposition: A | Discharge status: Home / Self Care | Source: Ambulatory Visit | Attending: Family Medicine | Admitting: Family Medicine

## 2023-09-03 DIAGNOSIS — Z1231 Encounter for screening mammogram for malignant neoplasm of breast: Secondary | ICD-10-CM

## 2023-12-22 NOTE — Progress Notes (Unsigned)
 48 y.o. G47P0010 Married Asian female here for annual exam.  Having trouble sleeping and pain/dryness with intercourse.   State her true age in 60.    FSH 22 11/21/20.  Hot flashes have resolved.   Took a sleeping aid from [employer], which works well, but she does not want to be dependent on it.  Sleeps better when she does not need to go to the office the next day.   Notes that her mood is fragile.    Painful labial sores multiple times a year.  She thinks it could be herpes.   She will make an appointment when it occurs the next time.  No prior confirmation testing.   Constipation issues, increased with traveling.  Bleeding hemorrhoid.   From [place].  Husband is Naval [employer].  Works for a [employer]    PCP: Kip Righter, MD   No LMP recorded. Patient has had a hysterectomy.           Sexually active: Yes.    The current method of family planning is status post hysterectomy.    Menopausal hormone therapy:  n/a Exercising: Yes.    Gym everyday  Smoker:  Former   OB History  Gravida Para Term Preterm AB Living  1    1   SAB IAB Ectopic Multiple Live Births   1       # Outcome Date GA Lbr Len/2nd Weight Sex Type Anes PTL Lv  1 IAB              HEALTH MAINTENANCE: Last 2 paps:  11/21/20 neg, 05/22/19 neg History of abnormal Pap or positive HPV:  no Mammogram:   09/03/23 Breast Density Cat C, BIRADS Cat 1 neg  Colonoscopy:  never  Bone Density:  n/a  Result  n/a    There is no immunization history on file for this patient.    reports that she has quit smoking. She has never used smokeless tobacco. She reports current alcohol use. She reports that she does not use drugs.  Past Medical History:  Diagnosis Date   Cancer [facility])    Uterine    Past Surgical History:  Procedure Laterality Date   ABDOMINAL HYSTERECTOMY     TAH    Current Outpatient Medications  Medication Sig Dispense Refill   MELATONIN PO Take by mouth.     Multiple Vitamin (MULTIVITAMIN PO)  Take by mouth.     Rhubarb (ESTROVEN COMPLETE) 4 MG TABS as directed Orally     No current facility-administered medications for this visit.    ALLERGIES: Patient has no known allergies.  History reviewed. No pertinent family history.  Review of Systems  All other systems reviewed and are negative.   PHYSICAL EXAM:  BP 112/74 (BP Location: Left Arm, Patient Position: Sitting)   Pulse 80   Ht 5' 4 (1.626 m)   Wt 122 lb (55.3 kg)   SpO2 100%   BMI 20.94 kg/m     General appearance: alert, cooperative and appears stated age Head: normocephalic, without obvious abnormality, atraumatic Neck: no adenopathy, supple, symmetrical, trachea midline and thyroid normal to inspection and palpation Lungs: clear to auscultation bilaterally Breasts: normal appearance, no masses or tenderness, No nipple retraction or dimpling, No nipple discharge or bleeding, No axillary adenopathy Heart: regular rate and rhythm Abdomen: soft, non-tender; no masses, no organomegaly Extremities: extremities normal, atraumatic, no cyanosis or edema Skin: skin color, texture, turgor normal. No rashes or lesions.  A few pigmented nevi.  Lymph nodes: cervical, supraclavicular, and axillary nodes normal. Neurologic: grossly normal  Pelvic: External genitalia:  no lesions              No abnormal inguinal nodes palpated.              Urethra:  normal appearing urethra with no masses, tenderness or lesions              Bartholins and Skenes: normal                 Vagina: normal appearing vagina with normal color and discharge, no lesions              Cervix: absent              Pap taken: no Bimanual Exam:  Uterus: absent              Adnexa: no mass, fullness, tenderness              Rectal exam: yes.  Confirms.              Anus:  normal sphincter tone, no lesions  Chaperone was present for exam:  Kari HERO, CMA  ASSESSMENT: Well woman visit with gynecologic exam. Status post TAH for endometrial cancer at  age 52.  Surgery in [place].  Ovaries remain.  Hx insomnia.  Hemorrhoid. PHQ-2-9: 1  PLAN: Mammogram screening discussed. Self breast awareness reviewed. Pap and HRV collected:  no.  Not indicated. Guidelines for Calcium, Vitamin D, regular exercise program including cardiovascular and weight bearing exercise. Medication refills:  Vaginal vit E suppository 200 international units twice weekly at bedtime.  #8, RF 11.  Custom Care. Discussed cooking oils.  Referral to gastroenterology for hemorrhoid. Establish care with [facility] for skin checks.  Labs with PCP.  Follow up:  yearly and prn.

## 2023-12-23 ENCOUNTER — Encounter: Payer: Self-pay | Admitting: Obstetrics and Gynecology

## 2023-12-23 ENCOUNTER — Ambulatory Visit (INDEPENDENT_AMBULATORY_CARE_PROVIDER_SITE_OTHER): Admitting: Obstetrics and Gynecology

## 2023-12-23 VITALS — BP 112/74 | HR 80 | Ht 64.0 in | Wt 122.0 lb

## 2023-12-23 DIAGNOSIS — K649 Unspecified hemorrhoids: Secondary | ICD-10-CM

## 2023-12-23 DIAGNOSIS — Z1331 Encounter for screening for depression: Secondary | ICD-10-CM | POA: Diagnosis not present

## 2023-12-23 DIAGNOSIS — Z01419 Encounter for gynecological examination (general) (routine) without abnormal findings: Secondary | ICD-10-CM | POA: Diagnosis not present

## 2023-12-23 MED ORDER — NONFORMULARY OR COMPOUNDED ITEM: 11 refills | Status: AC

## 2023-12-23 NOTE — Patient Instructions (Signed)

## 2025-01-04 ENCOUNTER — Ambulatory Visit: Admitting: Obstetrics and Gynecology
